# Patient Record
Sex: Female | Born: 1960 | Race: White | Hispanic: No | Smoking: Current every day smoker
Health system: Southern US, Community
[De-identification: ages and names within clinical notes are randomized; demographics above are authoritative.]

## PROBLEM LIST (undated history)

## (undated) HISTORY — PX: CHOLECYSTECTOMY: SHX55

## (undated) HISTORY — PX: ABDOMINAL HYSTERECTOMY: SHX81

---

## 2011-09-06 ENCOUNTER — Emergency Department (HOSPITAL_COMMUNITY): Payer: Self-pay

## 2011-09-06 ENCOUNTER — Other Ambulatory Visit: Payer: Self-pay

## 2011-09-06 ENCOUNTER — Emergency Department (HOSPITAL_COMMUNITY)
Admission: EM | Admit: 2011-09-06 | Discharge: 2011-09-06 | Disposition: A | Discharge status: Home / Self Care | Payer: No Typology Code available for payment source | Attending: Emergency Medicine | Admitting: Emergency Medicine

## 2011-09-06 DIAGNOSIS — M549 Dorsalgia, unspecified: Secondary | ICD-10-CM | POA: Insufficient documentation

## 2011-09-06 DIAGNOSIS — W010XXA Fall on same level from slipping, tripping and stumbling without subsequent striking against object, initial encounter: Secondary | ICD-10-CM | POA: Insufficient documentation

## 2011-09-06 DIAGNOSIS — W19XXXA Unspecified fall, initial encounter: Secondary | ICD-10-CM

## 2011-09-06 DIAGNOSIS — M25519 Pain in unspecified shoulder: Secondary | ICD-10-CM | POA: Insufficient documentation

## 2011-09-06 DIAGNOSIS — Y9229 Other specified public building as the place of occurrence of the external cause: Secondary | ICD-10-CM | POA: Insufficient documentation

## 2011-09-06 DIAGNOSIS — M542 Cervicalgia: Secondary | ICD-10-CM | POA: Insufficient documentation

## 2011-09-06 DIAGNOSIS — S161XXA Strain of muscle, fascia and tendon at neck level, initial encounter: Secondary | ICD-10-CM

## 2011-09-06 DIAGNOSIS — R51 Headache: Secondary | ICD-10-CM | POA: Insufficient documentation

## 2011-09-06 DIAGNOSIS — S139XXA Sprain of joints and ligaments of unspecified parts of neck, initial encounter: Secondary | ICD-10-CM | POA: Insufficient documentation

## 2011-09-06 DIAGNOSIS — S0990XA Unspecified injury of head, initial encounter: Secondary | ICD-10-CM | POA: Insufficient documentation

## 2011-09-06 LAB — POCT I-STAT, CHEM 8
BUN: 14 mg/dL (ref 6–23)
Creatinine, Ser: 1 mg/dL (ref 0.50–1.10)
Glucose, Bld: 127 mg/dL — ABNORMAL HIGH (ref 70–99)
Hemoglobin: 14.3 g/dL (ref 12.0–15.0)
Potassium: 4.1 mEq/L (ref 3.5–5.1)

## 2011-09-06 MED ORDER — IBUPROFEN 800 MG PO TABS: 800.0000 mg | ORAL_TABLET | Freq: Three times a day (TID) | ORAL | Status: AC

## 2011-09-06 MED ORDER — HYDROCODONE-ACETAMINOPHEN 5-325 MG PO TABS: 2.0000 | ORAL_TABLET | ORAL | Status: AC | PRN

## 2011-09-06 MED ORDER — HYDROCODONE-ACETAMINOPHEN 5-325 MG PO TABS
2.0000 | ORAL_TABLET | Freq: Once | ORAL | Status: AC
  Administered 2011-09-06: 2 via ORAL
  Filled 2011-09-06: qty 2

## 2011-09-06 NOTE — ED Notes (Signed)
Pt remains in xray.
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
## 2011-09-06 NOTE — ED Notes (Signed)
QMV:[id number]<NG> Expected date:09/06/11<BR> Expected time:12:26 PM<BR> Means of arrival:Ambulance<BR> Comments:<BR> EMS 40 GC - fall/head,neck and back pain

## 2011-09-06 NOTE — ED Provider Notes (Signed)
History     CSN: [id number] Arrival date & time: 09/06/2011  1:03 PM   First MD Initiated Contact with Patient 09/06/11 1307      Chief Complaint  Patient presents with  . Fall    (Consider location/radiation/quality/duration/timing/severity/associated sxs/prior treatment) HPI Comments: Patient presents by EMS after having a mechanical fall at [facility]. She states she was reaching into a bin to get a drink and fell on a wet spot on the floor. She hit the back of her head but did not lose consciousness. She denies any weakness, numbness, tingling. She did urinate on herself in the ambulance because she says she really had to use the bathroom and could not hold it. His any seizure activity, tongue biting, incontinence. Chest pain, abdominal pain, low back pain. She denies any dizziness, lightheadedness, visual change.  She is also complaining of some right shoulder and left knee pain from the fall.  The history is provided by the patient.    History reviewed. No pertinent past medical history.  History reviewed. No pertinent past surgical history.  History reviewed. No pertinent family history.  History  Substance Use Topics  . Smoking status: Not on file  . Smokeless tobacco: Not on file  . Alcohol Use:     OB History    Grav Para Term Preterm Abortions TAB SAB Ect Mult Living                  Review of Systems  Constitutional: Negative for fever, activity change and appetite change.  HENT: Positive for neck pain and neck stiffness. Negative for congestion and rhinorrhea.   Eyes: Negative for visual disturbance.  Respiratory: Negative for cough, choking and shortness of breath.   Cardiovascular: Negative for chest pain.  Gastrointestinal: Negative for nausea, vomiting and abdominal pain.  Genitourinary: Negative for dysuria and hematuria.  Musculoskeletal: Positive for back pain.  Skin: Negative for wound.  Neurological: Positive for headaches. Negative for dizziness  and weakness.  Hematological: Negative for adenopathy.    Allergies  Review of patient's allergies indicates no known allergies.  Home Medications   Current Outpatient Rx  Name Route Sig Dispense Refill  . ASPIRIN 325 MG PO TABS Oral Take 325 mg by mouth daily as needed. pain      . DEXTROMETHORPHAN-GUAIFENESIN 10-100 MG/5ML PO LIQD Oral Take 5 mLs by mouth every 4 (four) hours as needed. For cough and congestion     . HYDROCODONE-ACETAMINOPHEN 5-325 MG PO TABS Oral Take 2 tablets by mouth every 4 (four) hours as needed for pain. 10 tablet 0  . IBUPROFEN 800 MG PO TABS Oral Take 1 tablet (800 mg total) by mouth 3 (three) times daily. 21 tablet 0    BP 103/64  Pulse 62  Temp(Src) 98.4 F (36.9 C) (Oral)  Resp 20  SpO2 100%  Physical Exam  Constitutional: She is oriented to person, place, and time. She appears well-developed and well-nourished. No distress.  HENT:  Head: Normocephalic and atraumatic.  Right Ear: External ear normal.  Left Ear: External ear normal.  Mouth/Throat: Oropharynx is clear and moist. No oropharyngeal exudate.  Eyes: Conjunctivae and EOM are normal. Pupils are equal, round, and reactive to light.  Neck: Normal range of motion.       Diffuse tenderness in the midline and paraspinally without step-off or deformity  Cardiovascular: Normal rate, regular rhythm and normal heart sounds.   Pulmonary/Chest: Effort normal and breath sounds normal. No respiratory distress.  Abdominal: Soft. There  is no tenderness. There is no rebound and no guarding.  Musculoskeletal: Normal range of motion. She exhibits no edema and no tenderness.       No tenderness to T. or L-spine, no step-offs or deformity Full range of Motion left knee with extensor and flexor function intact.  No ligament laxity.  Neurological: She is alert and oriented to person, place, and time. No cranial nerve deficit.  Skin: Skin is warm.    ED Course  Procedures (including critical care  time)  Labs Reviewed  POCT I-STAT, CHEM 8 - Abnormal; Notable for the following:    Glucose, Bld 127 (*)    All other components within normal limits  I-STAT, CHEM 8   Dg Chest 2 View  09/06/2011  *RADIOLOGY REPORT*  Clinical Data: Fall.  Chest pain.  CHEST - 2 VIEW  Comparison:  None.  Findings:  The heart size and mediastinal contours are within normal limits.  Both lungs are clear.  No evidence of pneumothorax or pleural effusion.  The visualized skeletal structures are unremarkable.  IMPRESSION: No active cardiopulmonary disease.  Original Report Authenticated By: Danae Orleans, M.D.   Dg Shoulder Right  09/06/2011  *RADIOLOGY REPORT*  Clinical Data: Fall.  Shoulder injury and pain.  RIGHT SHOULDER - 2+ VIEW  Comparison: None.  Findings: No evidence of acute fracture or dislocation.  Soft tissue calcification is seen near the distal rotator cuff tendon insertion along the lateral aspect of the humeral head, suspicious for calcific tendonitis.  IMPRESSION:  1.  No acute findings. 2.  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  Original Report Authenticated By: Danae Orleans, M.D.   Ct Head Wo Contrast  09/06/2011  *RADIOLOGY REPORT*  Clinical Data: The patient fell and hit back of the head today. Headache and neck pain.  CT HEAD WITHOUT CONTRAST  Technique:  Contiguous axial images were obtained from the base of the skull through the vertex without contrast.  Comparison: None.  Findings: There are no midline shifts or mass lesions.  There is no CT scan evidence for hemorrhage or acute infarction.  The ventricles are normal in size and contour.  Bone windows settings demonstrate no fractures or sinus air fluid levels. There is a 1 cm in size low density sub dermal scalp lesion within the right occipital region.  Most likely this represents a small incidental sebaceous cyst.  IMPRESSION: Probable small occipital sebaceous cyst.  No acute intracranial findings.  Original  Report Authenticated By: Rolla Plate, M.D.   Ct Cervical Spine Wo Contrast  09/06/2011  *RADIOLOGY REPORT*  Clinical Data: Fall.  Neck pain.  CT CERVICAL SPINE WITHOUT CONTRAST  Technique:  Multidetector CT imaging of the cervical spine was performed. Multiplanar CT image reconstructions were also generated.  Comparison: None.  Findings: Degenerative space narrowing present C5-6 level.  There is annular calcification associated with the disc posteriorly at this level.  There are no fractures or subluxations.  The C1-2 articulation has a normal appearance.  The prevertebral soft tissues have a normal appearance.  IMPRESSION: Degenerative disc space narrowing noted at the C5-6 level.  No acute findings.  Original Report Authenticated By: Rolla Plate, M.D.   Dg Knee Complete 4 Views Left  09/06/2011  *RADIOLOGY REPORT*  Clinical Data: Generalized left knee pain.  LEFT KNEE - COMPLETE 4+ VIEW  Comparison: None.  Findings: Moderately advanced osteoarthritis with primarily medial joint space loss and both medial and lateral proliferative changes. There are some  mild proliferative changes involving the patella. No knee joint effusion.  No acute fracture or dislocation.  IMPRESSION: Osteoarthritis primarily involving the medial joint space.  No evidence of acute fracture.  Original Report Authenticated By: Reola Calkins, M.D.     1. Fall   2. Cervical strain   3. Head injury       MDM  Mechanical fall after slipping on a wet floor. No loss of consciousness, normal neurological exam. She urinated on herself voluntarily. No reported seizure activity, tongue biting or incontinence.    Date: 09/06/2011  Rate: 68  Rhythm: normal sinus rhythm  QRS Axis: normal  Intervals: normal  ST/T Wave abnormalities: normal  Conduction Disutrbances:none  Narrative Interpretation:   Old EKG Reviewed: none available  Patient's imaging is reviewed and she has no acute injuries.  She is alert and  oriented, has a normal neuro exam.  She is ambulatory in the department.   Glynn Octave, MD 09/06/11 [phone]

## 2011-09-06 NOTE — ED Notes (Signed)
Pt was at [facility] and states that she fell on a wet spot on the floor.  She thinks that she hit the back of her head.  She is also c/o neck and upper back pain.

## 2011-09-06 NOTE — ED Notes (Signed)
Returned from xray

## 2012-08-06 ENCOUNTER — Emergency Department (HOSPITAL_COMMUNITY)
Admission: EM | Admit: 2012-08-06 | Discharge: 2012-08-06 | Disposition: A | Discharge status: Home / Self Care | Payer: No Typology Code available for payment source | Attending: Emergency Medicine | Admitting: Emergency Medicine

## 2012-08-06 ENCOUNTER — Emergency Department (HOSPITAL_COMMUNITY): Payer: No Typology Code available for payment source

## 2012-08-06 ENCOUNTER — Encounter (HOSPITAL_COMMUNITY): Payer: Self-pay | Admitting: *Deleted

## 2012-08-06 DIAGNOSIS — E119 Type 2 diabetes mellitus without complications: Secondary | ICD-10-CM | POA: Insufficient documentation

## 2012-08-06 DIAGNOSIS — R109 Unspecified abdominal pain: Secondary | ICD-10-CM | POA: Insufficient documentation

## 2012-08-06 DIAGNOSIS — Y9389 Activity, other specified: Secondary | ICD-10-CM | POA: Insufficient documentation

## 2012-08-06 DIAGNOSIS — S2249XA Multiple fractures of ribs, unspecified side, initial encounter for closed fracture: Secondary | ICD-10-CM | POA: Insufficient documentation

## 2012-08-06 DIAGNOSIS — R569 Unspecified convulsions: Secondary | ICD-10-CM | POA: Insufficient documentation

## 2012-08-06 DIAGNOSIS — Z9889 Other specified postprocedural states: Secondary | ICD-10-CM | POA: Insufficient documentation

## 2012-08-06 DIAGNOSIS — J4489 Other specified chronic obstructive pulmonary disease: Secondary | ICD-10-CM | POA: Insufficient documentation

## 2012-08-06 DIAGNOSIS — S199XXA Unspecified injury of neck, initial encounter: Secondary | ICD-10-CM | POA: Insufficient documentation

## 2012-08-06 DIAGNOSIS — S2241XA Multiple fractures of ribs, right side, initial encounter for closed fracture: Secondary | ICD-10-CM

## 2012-08-06 DIAGNOSIS — Z90710 Acquired absence of both cervix and uterus: Secondary | ICD-10-CM | POA: Insufficient documentation

## 2012-08-06 DIAGNOSIS — S0993XA Unspecified injury of face, initial encounter: Secondary | ICD-10-CM | POA: Insufficient documentation

## 2012-08-06 DIAGNOSIS — F17200 Nicotine dependence, unspecified, uncomplicated: Secondary | ICD-10-CM | POA: Insufficient documentation

## 2012-08-06 DIAGNOSIS — J449 Chronic obstructive pulmonary disease, unspecified: Secondary | ICD-10-CM | POA: Insufficient documentation

## 2012-08-06 MED ORDER — HYDROMORPHONE HCL PF 1 MG/ML IJ SOLN
1.0000 mg | Freq: Once | INTRAMUSCULAR | Status: AC
  Administered 2012-08-06: 1 mg via INTRAVENOUS
  Filled 2012-08-06: qty 1

## 2012-08-06 MED ORDER — KETOROLAC TROMETHAMINE 30 MG/ML IJ SOLN
30.0000 mg | Freq: Once | INTRAMUSCULAR | Status: AC
  Administered 2012-08-06: 30 mg via INTRAVENOUS
  Filled 2012-08-06: qty 1

## 2012-08-06 MED ORDER — NAPROXEN 500 MG PO TABS: 500.0000 mg | ORAL_TABLET | Freq: Two times a day (BID) | ORAL | Status: DC

## 2012-08-06 MED ORDER — OXYCODONE-ACETAMINOPHEN 5-325 MG PO TABS: 1.0000 | ORAL_TABLET | ORAL | Status: DC | PRN

## 2012-08-06 MED ORDER — IOHEXOL 300 MG/ML  SOLN
100.0000 mL | Freq: Once | INTRAMUSCULAR | Status: AC | PRN
  Administered 2012-08-06: 100 mL via INTRAVENOUS

## 2012-08-06 NOTE — ED Notes (Signed)
I placed a medium c-collar on the patient. 

## 2012-08-06 NOTE — ED Notes (Signed)
IS taught demonstrated modeled. Signature pad signed.

## 2012-08-06 NOTE — ED Notes (Signed)
Ambulated into h/w, steady gait, with stand by assist, (denies: sob, nausea or dizziness), "just severe pain with movement, deep inspiration and cough". VSS, improved after IVF & ambulation. Pt called for ride home, "sister on her way".

## 2012-08-06 NOTE — ED Notes (Signed)
EDP into room 

## 2012-08-06 NOTE — ED Notes (Signed)
Coached to frequently/ routinely cough and take deep breaths. Pt demonstrates well.
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
## 2012-08-06 NOTE — ED Notes (Signed)
BP & HR low, EDP made aware, order received to ambulate and re-check.

## 2012-08-06 NOTE — ED Notes (Signed)
Out to d/c desk in w/c, out with RN to w/r, sitting in w/c in w/r, waiting for sister to arrive, nurse & tech first updated. Alert, NAD, calm, interactive.

## 2012-08-06 NOTE — ED Notes (Signed)
Family into room. 
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
## 2012-08-06 NOTE — ED Provider Notes (Signed)
Prior to being discharged, the patient had a borderline blood pressure, she ambulated with minimal assistance only secondary to her rib pain, oxygenation remained normal, no tachycardia, no hypotension with a blood pressure of 105 systolic. The patient is stable for discharge and the CT confirms no mediastinal cardiac or pulmonary injuries. There is also no signs of intraoral abdominal injuries or bleeding.  Vida Roller, MD 08/06/12 [phone]
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
## 2016-04-03 ENCOUNTER — Emergency Department (HOSPITAL_COMMUNITY)
Admission: EM | Admit: 2016-04-03 | Discharge: 2016-04-03 | Disposition: A | Discharge status: Home / Self Care | Payer: No Typology Code available for payment source | Attending: Emergency Medicine | Admitting: Emergency Medicine

## 2016-04-03 ENCOUNTER — Encounter (HOSPITAL_COMMUNITY): Payer: Self-pay | Admitting: Emergency Medicine

## 2016-04-03 DIAGNOSIS — R159 Full incontinence of feces: Secondary | ICD-10-CM | POA: Insufficient documentation

## 2016-04-03 DIAGNOSIS — IMO0002 Reserved for concepts with insufficient information to code with codable children: Secondary | ICD-10-CM

## 2016-04-03 DIAGNOSIS — N8110 Cystocele, unspecified: Secondary | ICD-10-CM | POA: Insufficient documentation

## 2016-04-03 DIAGNOSIS — E119 Type 2 diabetes mellitus without complications: Secondary | ICD-10-CM | POA: Insufficient documentation

## 2016-04-03 DIAGNOSIS — Z79899 Other long term (current) drug therapy: Secondary | ICD-10-CM | POA: Insufficient documentation

## 2016-04-03 DIAGNOSIS — Z7984 Long term (current) use of oral hypoglycemic drugs: Secondary | ICD-10-CM | POA: Insufficient documentation

## 2016-04-03 DIAGNOSIS — F17200 Nicotine dependence, unspecified, uncomplicated: Secondary | ICD-10-CM | POA: Insufficient documentation

## 2016-04-03 LAB — URINE MICROSCOPIC-ADD ON: RBC / HPF: NONE SEEN RBC/hpf (ref 0–5)

## 2016-04-03 LAB — URINALYSIS, ROUTINE W REFLEX MICROSCOPIC
BILIRUBIN URINE: NEGATIVE
Glucose, UA: >1000 mg/dL — AB
Hgb urine dipstick: NEGATIVE
KETONES UR: NEGATIVE mg/dL
LEUKOCYTES UA: NEGATIVE
NITRITE: NEGATIVE
PROTEIN: NEGATIVE mg/dL
Specific Gravity, Urine: 1.035 — ABNORMAL HIGH (ref 1.005–1.030)
pH: 6 (ref 5.0–8.0)

## 2016-04-03 LAB — BASIC METABOLIC PANEL
ANION GAP: 10 (ref 5–15)
BUN: 19 mg/dL (ref 6–20)
CALCIUM: 9.9 mg/dL (ref 8.9–10.3)
CO2: 26 mmol/L (ref 22–32)
CREATININE: 1.06 mg/dL — AB (ref 0.44–1.00)
Chloride: 98 mmol/L — ABNORMAL LOW (ref 101–111)
GFR, EST NON AFRICAN AMERICAN: 58 mL/min — AB (ref >60)
Glucose, Bld: 383 mg/dL — ABNORMAL HIGH (ref 65–99)
Potassium: 4.7 mmol/L (ref 3.5–5.1)
Sodium: 134 mmol/L — ABNORMAL LOW (ref 135–145)

## 2016-04-03 LAB — CBC
HCT: 42.6 % (ref 36.0–46.0)
HEMOGLOBIN: 14.1 g/dL (ref 12.0–15.0)
MCH: 28.7 pg (ref 26.0–34.0)
MCHC: 33.1 g/dL (ref 30.0–36.0)
MCV: 86.8 fL (ref 78.0–100.0)
PLATELETS: 255 10*3/uL (ref 150–400)
RBC: 4.91 MIL/uL (ref 3.87–5.11)
RDW: 12.4 % (ref 11.5–15.5)
WBC: 10.9 10*3/uL — ABNORMAL HIGH (ref 4.0–10.5)

## 2016-04-03 MED ORDER — METFORMIN HCL 500 MG PO TABS: 500.0000 mg | ORAL_TABLET | Freq: Two times a day (BID) | ORAL | Status: AC

## 2016-04-03 MED ORDER — METFORMIN HCL 500 MG PO TABS
500.0000 mg | ORAL_TABLET | Freq: Once | ORAL | Status: AC
  Administered 2016-04-03: 500 mg via ORAL
  Filled 2016-04-03: qty 1

## 2016-04-03 NOTE — Care Management Note (Signed)
Case Management Note  Patient Details  Name: Skip Mayerondra XXXCovington MRN: [id number] Date of Birth: 31-Mar-1961  Subjective/Objective: 55 y.o. F seen in the ED 04/03/2016. Patient had sought treatment in the ED for prolapsed bladder? and found to have Elevated Blood glucose. CM consulted by MD for new PCP and medication needs.  Pt tearful and crying uncontrollably throughout our discussion of need for PCP and other issues involving her new Diabetes diagnosis. Tommas OlpFiance, Byron at the bedside extremely helpful and willing to assist this pt. Janese Banksondra can be reached @ [phone] and Ortencia KickByron can be reached at [phone].   [facility] as they have Open clinic hours each week. Encouraged her to arrive early and be patient. Provided the couple with bus passes for transportation to the clinic for this week. They understand the importance of obtaining a PCP, Following up on this hospital visit and obtaining assistance with Insurance. All of this is provided at the clinic.   CM reviewed EPIC notes and chart review information CM [facility] ($[facility], does not include refills, 7 day expiration of MATCH letter and choice of pharmacies) Pt agreed to receive assistance from program   [facility] (unable to find pt listed in PDMI per cardholder name inquiry) PDMI information entered. MATCH letter completed and provided to pt.  CM updated EDP and ED RN              Action/Plan:CM will sign off for now but will be available should additional discharge needs arise or disposition change.    Expected Discharge Date:                  Expected Discharge Plan:     In-House Referral:  PCP / [facility], Clinical Social Work  Discharge planning Services  CM Consult, Medication Assistance  Post Acute Care Choice:    Choice offered to:  Patient  DME Arranged:    DME Agency:     HH  Arranged:    HH Agency:     Status of Service:  Completed, signed off  Medicare Important Message Given:    Date Medicare IM Given:    Medicare IM give by:    Date Additional Medicare IM Given:    Additional Medicare Important Message give by:     If discussed at Long Length of Stay Meetings, dates discussed:    Additional Comments:  Yvone NeuCrutchfield, Sue Mcalexander M, RN 04/03/2016, 11:14 AM

## 2016-04-03 NOTE — ED Provider Notes (Signed)
CSN: [id number]     Arrival date & time 04/03/16  0701 History   First MD Initiated Contact with Patient 04/03/16 [phone]     Chief Complaint  Patient presents with  . Urinary Incontinence  . Encopresis     (Consider location/radiation/quality/duration/timing/severity/associated sxs/prior Treatment) HPI   55yo female presents with progressively worsening urinary incontinence, 2 episodes of encoparesis which started this AM.  Urinary incontinence has been progressively worsening for months. Reports urinary incontinence with cough, laugh, sneeze, but also leaking when she stands up.  Feels like something is protruding Tried bladder pill for UTI over the counter, has not seen physician.  Encopresis started today, no other n/v/abd pain/fever.    History reviewed. No pertinent past medical history. Past Surgical History  Procedure Laterality Date  . Cholecystectomy    . Abdominal hysterectomy     No family history on file. Social History  Substance Use Topics  . Smoking status: Current Every Day Smoker  . Smokeless tobacco: None  . Alcohol Use: Yes   OB History    No data available     Review of Systems  Constitutional: Negative for fever.  HENT: Negative for sore throat.   Eyes: Negative for visual disturbance.  Respiratory: Negative for cough and shortness of breath.   Cardiovascular: Negative for chest pain.  Gastrointestinal: Negative for nausea, vomiting, abdominal pain and constipation. Diarrhea: encopresis x2.  Genitourinary: Positive for difficulty urinating (incontinence).  Musculoskeletal: Negative for back pain and neck pain.  Skin: Negative for rash.  Neurological: Negative for syncope and headaches.      Allergies  Penicillins  Home Medications   Prior to Admission medications   Medication Sig Start Date End Date Taking? Authorizing Provider  metFORMIN (GLUCOPHAGE) 500 MG tablet Take 1 tablet (500 mg total) by mouth 2 (two) times daily with a meal. 04/03/16  05/03/16  Alvira MondayErin Makinzee Durley, MD  naproxen (NAPROSYN) 500 MG tablet Take 1 tablet (500 mg total) by mouth 2 (two) times daily with a meal. Patient not taking: Reported on 04/03/2016 08/06/12   Eber HongBrian Miller, MD  oxyCODONE-acetaminophen (PERCOCET) 5-325 MG per tablet Take 1 tablet by mouth every 4 (four) hours as needed for pain. Patient not taking: Reported on 04/03/2016 08/06/12   Eber HongBrian Miller, MD   BP 108/69 mmHg  Pulse 87  Temp(Src) 98.4 F (36.9 C) (Oral)  Resp 18  SpO2 100% Physical Exam  Constitutional: She is oriented to person, place, and time. She appears well-developed and well-nourished. No distress.  HENT:  Head: Normocephalic and atraumatic.  Eyes: Conjunctivae and EOM are normal.  Neck: Normal range of motion.  Cardiovascular: Normal rate, regular rhythm and intact distal pulses.   Pulmonary/Chest: Effort normal. No respiratory distress.  Abdominal: Soft. She exhibits no distension. There is tenderness (diffuse mild). There is no guarding.  Genitourinary: There is no rash or tenderness on the right labia. There is no rash or tenderness on the left labia.  Cystocele visualized in lower portion of vagina  Musculoskeletal: She exhibits no edema or tenderness.  Neurological: She is alert and oriented to person, place, and time.  Skin: Skin is warm and dry. No rash noted. She is not diaphoretic. No erythema.  Nursing note and vitals reviewed.   ED Course  Procedures (including critical care time) Labs Review Labs Reviewed  URINALYSIS, ROUTINE W REFLEX MICROSCOPIC (NOT AT [facility]) - Abnormal; Notable for the following:    APPearance CLOUDY (*)    Specific Gravity, Urine 1.035 (*)  Glucose, UA >1000 (*)    All other components within normal limits  BASIC METABOLIC PANEL - Abnormal; Notable for the following:    Sodium 134 (*)    Chloride 98 (*)    Glucose, Bld 383 (*)    Creatinine, Ser 1.06 (*)    GFR calc non Af Amer 58 (*)    All other components within normal limits   CBC - Abnormal; Notable for the following:    WBC 10.9 (*)    All other components within normal limits  URINE MICROSCOPIC-ADD ON - Abnormal; Notable for the following:    Squamous Epithelial / LPF 0-5 (*)    Bacteria, UA MANY (*)    All other components within normal limits  URINE CULTURE    Imaging Review No results found. I have personally reviewed and evaluated these images and lab results as part of my medical decision-making.   EKG Interpretation None      MDM   Final diagnoses:  Cystocele  Type 2 diabetes mellitus without complication, without long-term current use of insulin (HCC)   55 year old female with history of hysterectomy presents with concern for months of progressive urinary incontinent continence, feeling a protrusion from vagina, with encopresis beginning last night.  Patient denies any back pain today, and given duration of symptoms, no leg weakness, presence of cystocele on exam, doubt cauda equina or spinal etiology of symptoms.  She does report right leg numbness which is chronic, which goes from back to bottom of toes,  However this is also chronic, more consistent with sciatica, no weakness.    Labs were obtained through triage protocol, and showed hyperglycemia with a glucose of 380.  There are no signs of DKA, no anion gap, normal bicarbonate. Patient was given a dose of metformin in the emergency department, is all prescription for the same to take 500 mg twice a day. Discussed likely diabetes diagnosis with patient in detail and alterations in diet and exercise. Discussed with case management, who came to bedside and saw patient, provided resources including resources for follow-up at [facility] for help with medications, insurance an appointment, as well as provided with medication assistance information.   For urinary symptoms, Patient's history and physical exam is most consistent with cystocele. Discussed pelvic floor  exercises, need for GYN follow up. Patient discharged in stable condition with understanding of reasons to return.   Alvira Monday, MD 04/03/16 1143

## 2016-04-03 NOTE — ED Notes (Signed)
Case management at bedside.

## 2016-04-03 NOTE — ED Notes (Signed)
Pt reports intermittent urinary incontinence for past month, recent bowel incontinence.  Pt states, "I know my bladder is trying to fall out."   Pt reports sensation of "something pressing between my legs."

## 2016-04-03 NOTE — ED Notes (Signed)
MD at bedside. 

## 2016-04-03 NOTE — Discharge Instructions (Signed)

## 2016-04-05 LAB — URINE CULTURE

## 2016-04-07 MED FILL — metFORMIN HCL 500 MG TABS: 500 | 30 days supply | Qty: 60 | Fill #0

## 2022-03-08 ENCOUNTER — Emergency Department (HOSPITAL_COMMUNITY): Payer: Medicaid Other

## 2022-03-08 ENCOUNTER — Encounter (HOSPITAL_COMMUNITY): Payer: Self-pay | Admitting: Student

## 2022-03-08 ENCOUNTER — Emergency Department (HOSPITAL_COMMUNITY)
Admission: EM | Admit: 2022-03-08 | Discharge: 2022-03-09 | Disposition: A | Discharge status: Home / Self Care | Payer: Medicaid Other | Attending: Emergency Medicine | Admitting: Emergency Medicine

## 2022-03-08 ENCOUNTER — Other Ambulatory Visit: Payer: Self-pay

## 2022-03-08 DIAGNOSIS — Z7984 Long term (current) use of oral hypoglycemic drugs: Secondary | ICD-10-CM | POA: Insufficient documentation

## 2022-03-08 DIAGNOSIS — F17200 Nicotine dependence, unspecified, uncomplicated: Secondary | ICD-10-CM | POA: Insufficient documentation

## 2022-03-08 DIAGNOSIS — R0602 Shortness of breath: Secondary | ICD-10-CM | POA: Diagnosis not present

## 2022-03-08 DIAGNOSIS — R0789 Other chest pain: Secondary | ICD-10-CM | POA: Diagnosis not present

## 2022-03-08 DIAGNOSIS — E1165 Type 2 diabetes mellitus with hyperglycemia: Secondary | ICD-10-CM | POA: Diagnosis not present

## 2022-03-08 DIAGNOSIS — R739 Hyperglycemia, unspecified: Secondary | ICD-10-CM

## 2022-03-08 DIAGNOSIS — Z720 Tobacco use: Secondary | ICD-10-CM

## 2022-03-08 LAB — COMPREHENSIVE METABOLIC PANEL
ALT: 33 U/L (ref 0–44)
AST: 23 U/L (ref 15–41)
Albumin: 4 g/dL (ref 3.5–5.0)
Alkaline Phosphatase: 91 U/L (ref 38–126)
Anion gap: 7 (ref 5–15)
BUN: 26 mg/dL — ABNORMAL HIGH (ref 8–23)
CO2: 27 mmol/L (ref 22–32)
Calcium: 9.4 mg/dL (ref 8.9–10.3)
Chloride: 104 mmol/L (ref 98–111)
Creatinine, Ser: 0.79 mg/dL (ref 0.44–1.00)
GFR, Estimated: >60 mL/min (ref >60)
Glucose, Bld: 222 mg/dL — ABNORMAL HIGH (ref 70–99)
Potassium: 3.8 mmol/L (ref 3.5–5.1)
Sodium: 138 mmol/L (ref 135–145)
Total Bilirubin: 0.8 mg/dL (ref 0.3–1.2)
Total Protein: 8 g/dL (ref 6.5–8.1)

## 2022-03-08 LAB — CBC WITH DIFFERENTIAL/PLATELET
Abs Immature Granulocytes: 0.02 10*3/uL (ref 0.00–0.07)
Basophils Absolute: 0 10*3/uL (ref 0.0–0.1)
Basophils Relative: 0 %
Eosinophils Absolute: 0.1 10*3/uL (ref 0.0–0.5)
Eosinophils Relative: 2 %
HCT: 44.3 % (ref 36.0–46.0)
Hemoglobin: 14.6 g/dL (ref 12.0–15.0)
Immature Granulocytes: 0 %
Lymphocytes Relative: 31 %
Lymphs Abs: 2 10*3/uL (ref 0.7–4.0)
MCH: 29.7 pg (ref 26.0–34.0)
MCHC: 33 g/dL (ref 30.0–36.0)
MCV: 90.2 fL (ref 80.0–100.0)
Monocytes Absolute: 0.5 10*3/uL (ref 0.1–1.0)
Monocytes Relative: 8 %
Neutro Abs: 3.8 10*3/uL (ref 1.7–7.7)
Neutrophils Relative %: 59 %
Platelets: 229 10*3/uL (ref 150–400)
RBC: 4.91 MIL/uL (ref 3.87–5.11)
RDW: 12.5 % (ref 11.5–15.5)
WBC: 6.5 10*3/uL (ref 4.0–10.5)
nRBC: 0 % (ref 0.0–0.2)

## 2022-03-08 LAB — TROPONIN I (HIGH SENSITIVITY): Troponin I (High Sensitivity): 4 ng/L (ref <18)

## 2022-03-08 LAB — D-DIMER, QUANTITATIVE: D-Dimer, Quant: 0.48 ug/mL-FEU (ref 0.00–0.50)

## 2022-03-08 MED ORDER — IPRATROPIUM-ALBUTEROL 0.5-2.5 (3) MG/3ML IN SOLN
3.0000 mL | Freq: Once | RESPIRATORY_TRACT | Status: AC
  Administered 2022-03-08: 3 mL via RESPIRATORY_TRACT
  Filled 2022-03-08: qty 3

## 2022-03-08 NOTE — ED Triage Notes (Addendum)
Patient c/o SOB and weakness x 2-3 months. Per report dyspnea getting worse for the past weeks. Patient report molds is growing on her apartment that might cause her SOB. Pt denies N/V. Pt a/xo4.

## 2022-03-08 NOTE — ED Provider Notes (Signed)
[facility]-EMERGENCY DEPT Provider Note   CSN: [id number] Arrival date & time: 03/08/22  2110     History {Add pertinent medical, surgical, social history, OB history to HPI:1} Chief Complaint  Patient presents with   Shortness of Breath    Victoria Martinez is a 61 y.o. female with a hx of tobacco use who presents to the ED with complaints of *** HPI     Home Medications Prior to Admission medications   Medication Sig Start Date End Date Taking? Authorizing Provider  metFORMIN (GLUCOPHAGE) 500 MG tablet Take 1 tablet (500 mg total) by mouth 2 (two) times daily with a meal. 04/03/16 03/08/22 Yes Schlossman, Denny Peon, MD  naproxen (NAPROSYN) 500 MG tablet Take 1 tablet (500 mg total) by mouth 2 (two) times daily with a meal. Patient not taking: Reported on 03/08/2022 08/06/12   Eber Hong, MD  oxyCODONE-acetaminophen (PERCOCET) 5-325 MG per tablet Take 1 tablet by mouth every 4 (four) hours as needed for pain. Patient not taking: Reported on 04/03/2016 08/06/12   Eber Hong, MD      Allergies    Penicillins    Review of Systems   Review of Systems  Physical Exam Updated Vital Signs BP 100/72 (BP Location: Left Arm)   Pulse 82   Temp 98.1 F (36.7 C) (Oral)   Resp 18   Ht 5\' 3"  (1.6 m)   Wt 70.3 kg   SpO2 97%   BMI 27.46 kg/m  Physical Exam  ED Results / Procedures / Treatments   Labs (all labs ordered are listed, but only abnormal results are displayed) Labs Reviewed  COMPREHENSIVE METABOLIC PANEL - Abnormal; Notable for the following components:      Result Value   Glucose, Bld 222 (*)    BUN 26 (*)    All other components within normal limits  CBC WITH DIFFERENTIAL/PLATELET    EKG None  Radiology DG Chest 2 View  Result Date: 03/08/2022 CLINICAL DATA:  Shortness of breath EXAM: CHEST - 2 VIEW COMPARISON:  09/06/2011 FINDINGS: The heart size and mediastinal contours are within normal limits. Aortic atherosclerosis. Both lungs are  clear. The visualized skeletal structures are unremarkable. IMPRESSION: No active cardiopulmonary disease. Electronically Signed   By: 09/08/2011 M.D.   On: 03/08/2022 22:05    Procedures Procedures  {Document cardiac monitor, telemetry assessment procedure when appropriate:1}  Medications Ordered in ED Medications - No data to display  ED Course/ Medical Decision Making/ A&P                           Medical Decision Making Amount and/or Complexity of Data Reviewed Labs: ordered.   Patient presents to the ED with complaints of ***, this involves an extensive number of treatment options, and is a complaint that carries with it a high risk of complications and morbidity. Nontoxic, vitals ***.   Ddx including but not limited to: ***  Additional history obtained:  Chart & nursing note reviewed.  ***  EKG: No STEMI, ***  Lab Tests:  I viewed & interpreted labs including:  CBC: unremarkable, no critical anemia.  CMP: mild hyperglycemia *** no acidosis or anion gap elevation, mild elevation in BUN, no critical electrolyte derangement.   Imaging Studies:  I viewed the following imaging, agree with radiologist impression:  No active cardiopulmonary disease.   ED Course:  I ordered medications including *** for ***  ***: RE-EVAL: ***   Based on  patient's chief complaint, I considered admission might be necessary, however after reassuring ED workup feel patient is reasonable for discharge.   Critical Interventions: ***  Amount and/or Complexity of Data Reviewed Independent Historian: *** External Data Reviewed: *** Labs: *** Ordered, viewed, and interpreted- Decision-making details documented in ED Course. Radiology: ***Ordered, viewed, and interpretation performed. Decision-making details documented in ED Course. ECG/medicine tests: ***ordered and independent interpretation performed. Discussion of management or test interpretation with external provider(s): *** Social  determinants: ***   Portions of this note were generated with Scientist, clinical (histocompatibility and immunogenetics). Dictation errors may occur despite best attempts at proofreading.   {Document critical care time when appropriate:1} {Document review of labs and clinical decision tools ie heart score, Chads2Vasc2 etc:1}  {Document your independent review of radiology images, and any outside records:1} {Document your discussion with family members, caretakers, and with consultants:1} {Document social determinants of health affecting pt's care:1} {Document your decision making why or why not admission, treatments were needed:1} Final Clinical Impression(s) / ED Diagnoses Final diagnoses:  None    Rx / DC Orders ED Discharge Orders     None

## 2022-03-08 NOTE — ED Notes (Signed)
Blue tube sent to lab 

## 2022-03-09 LAB — TROPONIN I (HIGH SENSITIVITY)
Troponin I (High Sensitivity): 45 ng/L — ABNORMAL HIGH (ref <18)
Troponin I (High Sensitivity): 5 ng/L (ref <18)

## 2022-03-09 MED ORDER — ALBUTEROL SULFATE HFA 108 (90 BASE) MCG/ACT IN AERS
2.0000 | INHALATION_SPRAY | Freq: Once | RESPIRATORY_TRACT | Status: AC
  Administered 2022-03-09: 2 via RESPIRATORY_TRACT
  Filled 2022-03-09: qty 6.7

## 2022-03-09 MED ORDER — FLUTICASONE PROPIONATE 50 MCG/ACT NA SUSP: 1.0000 | Freq: Every day | NASAL | 0 refills | Status: AC

## 2022-03-09 MED ORDER — CETIRIZINE HCL 10 MG PO TABS: 10.0000 mg | ORAL_TABLET | Freq: Every day | ORAL | 0 refills | Status: AC

## 2022-03-09 NOTE — ED Notes (Signed)
Family still willing to wait for the specific NT to stick the patient. Will let the NT know.

## 2022-03-09 NOTE — ED Notes (Signed)
I provided reinforced discharge education based off of discharge instructions. Pt acknowledged and understood my education. Pt had no further questions/concerns for provider/myself.  °

## 2022-03-09 NOTE — Discharge Instructions (Addendum)
You were seen in the ER today for trouble breathing.  Your labs showed elevated blood sugar and BUN (kidney function test) please take your metformin as prescribed and have these rechecked by your primary care provider within 1 week. If you do not have a primary care provider see circled phone number for assistance or see our community clinic.   Please try to decrease your tobacco use as this can contribute to trouble breathing.    Please take Zyrtec daily and use Flonase 1 spray per nostril daily to help with possible allergic process. You may be allergic to the mold in your home which could be contributing to your shortness of breath.   Please use albuterol inhaler 1 to 2 puffs every 4-6 hours as needed for trouble breathing.  We have prescribed you new medication(s) today. Discuss the medications prescribed today with your pharmacist as they can have adverse effects and interactions with your other medicines including over the counter and prescribed medications. Seek medical evaluation if you start to experience new or abnormal symptoms after taking one of these medicines, seek care immediately if you start to experience difficulty breathing, feeling of your throat closing, facial swelling, or rash as these could be indications of a more serious allergic reaction  Please follow up with pulmonology, allergy, and primary care.   Return to the emergency department for new or worsening symptoms including but not limited to increased work of breathing, new or worsening pain, chest pain, passing out, coughing up blood, fever, or any other concerns.

## 2022-03-09 NOTE — ED Notes (Signed)
Pt insisted she needed to use the restroom before I start IV.

## 2022-03-21 DIAGNOSIS — F321 Major depressive disorder, single episode, moderate: Secondary | ICD-10-CM | POA: Diagnosis not present

## 2022-03-22 DIAGNOSIS — F321 Major depressive disorder, single episode, moderate: Secondary | ICD-10-CM | POA: Diagnosis not present

## 2022-03-23 DIAGNOSIS — F321 Major depressive disorder, single episode, moderate: Secondary | ICD-10-CM | POA: Diagnosis not present

## 2022-03-24 DIAGNOSIS — F321 Major depressive disorder, single episode, moderate: Secondary | ICD-10-CM | POA: Diagnosis not present

## 2022-03-25 DIAGNOSIS — F321 Major depressive disorder, single episode, moderate: Secondary | ICD-10-CM | POA: Diagnosis not present

## 2022-03-26 DIAGNOSIS — F321 Major depressive disorder, single episode, moderate: Secondary | ICD-10-CM | POA: Diagnosis not present

## 2022-03-28 DIAGNOSIS — F321 Major depressive disorder, single episode, moderate: Secondary | ICD-10-CM | POA: Diagnosis not present

## 2022-03-29 DIAGNOSIS — F321 Major depressive disorder, single episode, moderate: Secondary | ICD-10-CM | POA: Diagnosis not present

## 2022-03-30 DIAGNOSIS — F321 Major depressive disorder, single episode, moderate: Secondary | ICD-10-CM | POA: Diagnosis not present

## 2022-03-31 DIAGNOSIS — F321 Major depressive disorder, single episode, moderate: Secondary | ICD-10-CM | POA: Diagnosis not present

## 2022-04-01 DIAGNOSIS — F321 Major depressive disorder, single episode, moderate: Secondary | ICD-10-CM | POA: Diagnosis not present

## 2022-04-02 DIAGNOSIS — F321 Major depressive disorder, single episode, moderate: Secondary | ICD-10-CM | POA: Diagnosis not present

## 2022-04-04 DIAGNOSIS — F321 Major depressive disorder, single episode, moderate: Secondary | ICD-10-CM | POA: Diagnosis not present

## 2022-04-05 DIAGNOSIS — F321 Major depressive disorder, single episode, moderate: Secondary | ICD-10-CM | POA: Diagnosis not present

## 2022-04-06 DIAGNOSIS — F321 Major depressive disorder, single episode, moderate: Secondary | ICD-10-CM | POA: Diagnosis not present

## 2022-04-07 DIAGNOSIS — F321 Major depressive disorder, single episode, moderate: Secondary | ICD-10-CM | POA: Diagnosis not present

## 2022-04-08 DIAGNOSIS — F321 Major depressive disorder, single episode, moderate: Secondary | ICD-10-CM | POA: Diagnosis not present

## 2022-04-09 DIAGNOSIS — F321 Major depressive disorder, single episode, moderate: Secondary | ICD-10-CM | POA: Diagnosis not present

## 2022-04-11 DIAGNOSIS — F321 Major depressive disorder, single episode, moderate: Secondary | ICD-10-CM | POA: Diagnosis not present

## 2022-04-12 DIAGNOSIS — F321 Major depressive disorder, single episode, moderate: Secondary | ICD-10-CM | POA: Diagnosis not present

## 2022-04-13 DIAGNOSIS — F321 Major depressive disorder, single episode, moderate: Secondary | ICD-10-CM | POA: Diagnosis not present

## 2022-04-14 DIAGNOSIS — F321 Major depressive disorder, single episode, moderate: Secondary | ICD-10-CM | POA: Diagnosis not present

## 2022-04-15 DIAGNOSIS — F321 Major depressive disorder, single episode, moderate: Secondary | ICD-10-CM | POA: Diagnosis not present

## 2022-04-16 DIAGNOSIS — F321 Major depressive disorder, single episode, moderate: Secondary | ICD-10-CM | POA: Diagnosis not present

## 2022-04-18 DIAGNOSIS — F321 Major depressive disorder, single episode, moderate: Secondary | ICD-10-CM | POA: Diagnosis not present

## 2022-04-19 DIAGNOSIS — F321 Major depressive disorder, single episode, moderate: Secondary | ICD-10-CM | POA: Diagnosis not present

## 2022-04-20 DIAGNOSIS — F321 Major depressive disorder, single episode, moderate: Secondary | ICD-10-CM | POA: Diagnosis not present

## 2022-04-21 DIAGNOSIS — F321 Major depressive disorder, single episode, moderate: Secondary | ICD-10-CM | POA: Diagnosis not present

## 2022-04-22 DIAGNOSIS — F321 Major depressive disorder, single episode, moderate: Secondary | ICD-10-CM | POA: Diagnosis not present

## 2022-04-23 DIAGNOSIS — F321 Major depressive disorder, single episode, moderate: Secondary | ICD-10-CM | POA: Diagnosis not present

## 2022-04-25 DIAGNOSIS — F321 Major depressive disorder, single episode, moderate: Secondary | ICD-10-CM | POA: Diagnosis not present

## 2022-04-26 DIAGNOSIS — F321 Major depressive disorder, single episode, moderate: Secondary | ICD-10-CM | POA: Diagnosis not present

## 2022-04-27 DIAGNOSIS — F321 Major depressive disorder, single episode, moderate: Secondary | ICD-10-CM | POA: Diagnosis not present

## 2022-04-28 DIAGNOSIS — F321 Major depressive disorder, single episode, moderate: Secondary | ICD-10-CM | POA: Diagnosis not present

## 2022-04-29 DIAGNOSIS — F321 Major depressive disorder, single episode, moderate: Secondary | ICD-10-CM | POA: Diagnosis not present

## 2022-04-30 DIAGNOSIS — F321 Major depressive disorder, single episode, moderate: Secondary | ICD-10-CM | POA: Diagnosis not present

## 2022-05-02 DIAGNOSIS — F321 Major depressive disorder, single episode, moderate: Secondary | ICD-10-CM | POA: Diagnosis not present

## 2022-05-03 DIAGNOSIS — F321 Major depressive disorder, single episode, moderate: Secondary | ICD-10-CM | POA: Diagnosis not present

## 2022-05-04 DIAGNOSIS — F321 Major depressive disorder, single episode, moderate: Secondary | ICD-10-CM | POA: Diagnosis not present

## 2022-05-05 DIAGNOSIS — F321 Major depressive disorder, single episode, moderate: Secondary | ICD-10-CM | POA: Diagnosis not present

## 2022-05-06 DIAGNOSIS — F321 Major depressive disorder, single episode, moderate: Secondary | ICD-10-CM | POA: Diagnosis not present

## 2022-05-07 DIAGNOSIS — F321 Major depressive disorder, single episode, moderate: Secondary | ICD-10-CM | POA: Diagnosis not present

## 2022-05-09 DIAGNOSIS — F321 Major depressive disorder, single episode, moderate: Secondary | ICD-10-CM | POA: Diagnosis not present

## 2022-05-10 DIAGNOSIS — F321 Major depressive disorder, single episode, moderate: Secondary | ICD-10-CM | POA: Diagnosis not present

## 2022-05-11 DIAGNOSIS — F321 Major depressive disorder, single episode, moderate: Secondary | ICD-10-CM | POA: Diagnosis not present

## 2022-05-12 DIAGNOSIS — F321 Major depressive disorder, single episode, moderate: Secondary | ICD-10-CM | POA: Diagnosis not present

## 2022-05-13 DIAGNOSIS — F321 Major depressive disorder, single episode, moderate: Secondary | ICD-10-CM | POA: Diagnosis not present

## 2022-05-14 DIAGNOSIS — F321 Major depressive disorder, single episode, moderate: Secondary | ICD-10-CM | POA: Diagnosis not present

## 2022-05-16 DIAGNOSIS — F321 Major depressive disorder, single episode, moderate: Secondary | ICD-10-CM | POA: Diagnosis not present

## 2022-05-17 DIAGNOSIS — F321 Major depressive disorder, single episode, moderate: Secondary | ICD-10-CM | POA: Diagnosis not present

## 2022-05-18 DIAGNOSIS — F321 Major depressive disorder, single episode, moderate: Secondary | ICD-10-CM | POA: Diagnosis not present

## 2022-05-19 DIAGNOSIS — F321 Major depressive disorder, single episode, moderate: Secondary | ICD-10-CM | POA: Diagnosis not present

## 2022-05-20 DIAGNOSIS — F321 Major depressive disorder, single episode, moderate: Secondary | ICD-10-CM | POA: Diagnosis not present

## 2022-05-21 DIAGNOSIS — F321 Major depressive disorder, single episode, moderate: Secondary | ICD-10-CM | POA: Diagnosis not present

## 2022-05-23 DIAGNOSIS — F321 Major depressive disorder, single episode, moderate: Secondary | ICD-10-CM | POA: Diagnosis not present

## 2022-05-24 DIAGNOSIS — F321 Major depressive disorder, single episode, moderate: Secondary | ICD-10-CM | POA: Diagnosis not present

## 2022-05-25 DIAGNOSIS — F321 Major depressive disorder, single episode, moderate: Secondary | ICD-10-CM | POA: Diagnosis not present

## 2022-05-26 DIAGNOSIS — F321 Major depressive disorder, single episode, moderate: Secondary | ICD-10-CM | POA: Diagnosis not present

## 2022-05-27 DIAGNOSIS — F321 Major depressive disorder, single episode, moderate: Secondary | ICD-10-CM | POA: Diagnosis not present

## 2022-05-28 DIAGNOSIS — F321 Major depressive disorder, single episode, moderate: Secondary | ICD-10-CM | POA: Diagnosis not present

## 2022-05-31 DIAGNOSIS — F321 Major depressive disorder, single episode, moderate: Secondary | ICD-10-CM | POA: Diagnosis not present

## 2022-06-01 DIAGNOSIS — F321 Major depressive disorder, single episode, moderate: Secondary | ICD-10-CM | POA: Diagnosis not present

## 2022-06-02 DIAGNOSIS — F321 Major depressive disorder, single episode, moderate: Secondary | ICD-10-CM | POA: Diagnosis not present

## 2022-06-03 DIAGNOSIS — F321 Major depressive disorder, single episode, moderate: Secondary | ICD-10-CM | POA: Diagnosis not present

## 2022-06-04 DIAGNOSIS — F321 Major depressive disorder, single episode, moderate: Secondary | ICD-10-CM | POA: Diagnosis not present

## 2022-06-06 DIAGNOSIS — F321 Major depressive disorder, single episode, moderate: Secondary | ICD-10-CM | POA: Diagnosis not present

## 2022-06-07 DIAGNOSIS — F321 Major depressive disorder, single episode, moderate: Secondary | ICD-10-CM | POA: Diagnosis not present

## 2022-06-08 DIAGNOSIS — F321 Major depressive disorder, single episode, moderate: Secondary | ICD-10-CM | POA: Diagnosis not present

## 2022-06-09 DIAGNOSIS — F321 Major depressive disorder, single episode, moderate: Secondary | ICD-10-CM | POA: Diagnosis not present

## 2022-06-10 DIAGNOSIS — F321 Major depressive disorder, single episode, moderate: Secondary | ICD-10-CM | POA: Diagnosis not present

## 2022-06-11 DIAGNOSIS — F321 Major depressive disorder, single episode, moderate: Secondary | ICD-10-CM | POA: Diagnosis not present

## 2022-06-13 DIAGNOSIS — F321 Major depressive disorder, single episode, moderate: Secondary | ICD-10-CM | POA: Diagnosis not present

## 2022-06-14 DIAGNOSIS — F321 Major depressive disorder, single episode, moderate: Secondary | ICD-10-CM | POA: Diagnosis not present

## 2022-06-15 DIAGNOSIS — F321 Major depressive disorder, single episode, moderate: Secondary | ICD-10-CM | POA: Diagnosis not present

## 2022-06-16 DIAGNOSIS — F321 Major depressive disorder, single episode, moderate: Secondary | ICD-10-CM | POA: Diagnosis not present

## 2022-06-17 DIAGNOSIS — F321 Major depressive disorder, single episode, moderate: Secondary | ICD-10-CM | POA: Diagnosis not present

## 2022-06-18 DIAGNOSIS — F321 Major depressive disorder, single episode, moderate: Secondary | ICD-10-CM | POA: Diagnosis not present

## 2022-06-20 DIAGNOSIS — F321 Major depressive disorder, single episode, moderate: Secondary | ICD-10-CM | POA: Diagnosis not present

## 2022-06-21 DIAGNOSIS — F321 Major depressive disorder, single episode, moderate: Secondary | ICD-10-CM | POA: Diagnosis not present

## 2022-06-22 DIAGNOSIS — F321 Major depressive disorder, single episode, moderate: Secondary | ICD-10-CM | POA: Diagnosis not present

## 2022-06-23 DIAGNOSIS — F321 Major depressive disorder, single episode, moderate: Secondary | ICD-10-CM | POA: Diagnosis not present

## 2022-06-24 DIAGNOSIS — F321 Major depressive disorder, single episode, moderate: Secondary | ICD-10-CM | POA: Diagnosis not present

## 2022-06-25 DIAGNOSIS — F321 Major depressive disorder, single episode, moderate: Secondary | ICD-10-CM | POA: Diagnosis not present

## 2022-06-27 DIAGNOSIS — F321 Major depressive disorder, single episode, moderate: Secondary | ICD-10-CM | POA: Diagnosis not present

## 2022-06-28 DIAGNOSIS — F321 Major depressive disorder, single episode, moderate: Secondary | ICD-10-CM | POA: Diagnosis not present

## 2022-06-29 DIAGNOSIS — F321 Major depressive disorder, single episode, moderate: Secondary | ICD-10-CM | POA: Diagnosis not present

## 2022-06-30 DIAGNOSIS — F321 Major depressive disorder, single episode, moderate: Secondary | ICD-10-CM | POA: Diagnosis not present

## 2022-07-01 DIAGNOSIS — F321 Major depressive disorder, single episode, moderate: Secondary | ICD-10-CM | POA: Diagnosis not present

## 2022-07-02 DIAGNOSIS — F321 Major depressive disorder, single episode, moderate: Secondary | ICD-10-CM | POA: Diagnosis not present

## 2022-07-04 DIAGNOSIS — F321 Major depressive disorder, single episode, moderate: Secondary | ICD-10-CM | POA: Diagnosis not present

## 2022-07-05 DIAGNOSIS — F321 Major depressive disorder, single episode, moderate: Secondary | ICD-10-CM | POA: Diagnosis not present

## 2022-07-06 DIAGNOSIS — F321 Major depressive disorder, single episode, moderate: Secondary | ICD-10-CM | POA: Diagnosis not present

## 2022-07-07 DIAGNOSIS — F321 Major depressive disorder, single episode, moderate: Secondary | ICD-10-CM | POA: Diagnosis not present

## 2022-07-08 DIAGNOSIS — F321 Major depressive disorder, single episode, moderate: Secondary | ICD-10-CM | POA: Diagnosis not present

## 2022-07-09 DIAGNOSIS — F321 Major depressive disorder, single episode, moderate: Secondary | ICD-10-CM | POA: Diagnosis not present

## 2022-07-11 DIAGNOSIS — F321 Major depressive disorder, single episode, moderate: Secondary | ICD-10-CM | POA: Diagnosis not present

## 2022-07-12 DIAGNOSIS — F321 Major depressive disorder, single episode, moderate: Secondary | ICD-10-CM | POA: Diagnosis not present

## 2022-07-13 DIAGNOSIS — F321 Major depressive disorder, single episode, moderate: Secondary | ICD-10-CM | POA: Diagnosis not present

## 2022-07-14 DIAGNOSIS — F321 Major depressive disorder, single episode, moderate: Secondary | ICD-10-CM | POA: Diagnosis not present

## 2022-07-15 DIAGNOSIS — F321 Major depressive disorder, single episode, moderate: Secondary | ICD-10-CM | POA: Diagnosis not present

## 2022-07-16 DIAGNOSIS — F321 Major depressive disorder, single episode, moderate: Secondary | ICD-10-CM | POA: Diagnosis not present

## 2022-07-18 DIAGNOSIS — F321 Major depressive disorder, single episode, moderate: Secondary | ICD-10-CM | POA: Diagnosis not present

## 2022-07-19 DIAGNOSIS — F321 Major depressive disorder, single episode, moderate: Secondary | ICD-10-CM | POA: Diagnosis not present

## 2022-07-20 DIAGNOSIS — F321 Major depressive disorder, single episode, moderate: Secondary | ICD-10-CM | POA: Diagnosis not present

## 2022-07-21 DIAGNOSIS — F321 Major depressive disorder, single episode, moderate: Secondary | ICD-10-CM | POA: Diagnosis not present

## 2022-07-22 DIAGNOSIS — F321 Major depressive disorder, single episode, moderate: Secondary | ICD-10-CM | POA: Diagnosis not present

## 2022-07-23 DIAGNOSIS — F321 Major depressive disorder, single episode, moderate: Secondary | ICD-10-CM | POA: Diagnosis not present

## 2022-07-25 DIAGNOSIS — F321 Major depressive disorder, single episode, moderate: Secondary | ICD-10-CM | POA: Diagnosis not present

## 2022-07-26 DIAGNOSIS — F321 Major depressive disorder, single episode, moderate: Secondary | ICD-10-CM | POA: Diagnosis not present

## 2022-07-27 DIAGNOSIS — F321 Major depressive disorder, single episode, moderate: Secondary | ICD-10-CM | POA: Diagnosis not present

## 2022-07-28 DIAGNOSIS — F321 Major depressive disorder, single episode, moderate: Secondary | ICD-10-CM | POA: Diagnosis not present

## 2022-07-29 DIAGNOSIS — F321 Major depressive disorder, single episode, moderate: Secondary | ICD-10-CM | POA: Diagnosis not present

## 2022-07-30 DIAGNOSIS — F321 Major depressive disorder, single episode, moderate: Secondary | ICD-10-CM | POA: Diagnosis not present

## 2022-08-01 DIAGNOSIS — F321 Major depressive disorder, single episode, moderate: Secondary | ICD-10-CM | POA: Diagnosis not present

## 2022-08-02 DIAGNOSIS — F321 Major depressive disorder, single episode, moderate: Secondary | ICD-10-CM | POA: Diagnosis not present

## 2022-08-03 DIAGNOSIS — F321 Major depressive disorder, single episode, moderate: Secondary | ICD-10-CM | POA: Diagnosis not present

## 2022-08-04 DIAGNOSIS — F321 Major depressive disorder, single episode, moderate: Secondary | ICD-10-CM | POA: Diagnosis not present

## 2022-08-05 DIAGNOSIS — F321 Major depressive disorder, single episode, moderate: Secondary | ICD-10-CM | POA: Diagnosis not present

## 2022-08-06 DIAGNOSIS — F321 Major depressive disorder, single episode, moderate: Secondary | ICD-10-CM | POA: Diagnosis not present

## 2022-08-08 DIAGNOSIS — F321 Major depressive disorder, single episode, moderate: Secondary | ICD-10-CM | POA: Diagnosis not present

## 2022-08-09 DIAGNOSIS — F321 Major depressive disorder, single episode, moderate: Secondary | ICD-10-CM | POA: Diagnosis not present

## 2022-08-10 DIAGNOSIS — F321 Major depressive disorder, single episode, moderate: Secondary | ICD-10-CM | POA: Diagnosis not present

## 2022-08-11 DIAGNOSIS — F321 Major depressive disorder, single episode, moderate: Secondary | ICD-10-CM | POA: Diagnosis not present

## 2022-08-12 DIAGNOSIS — F321 Major depressive disorder, single episode, moderate: Secondary | ICD-10-CM | POA: Diagnosis not present

## 2022-08-13 DIAGNOSIS — F321 Major depressive disorder, single episode, moderate: Secondary | ICD-10-CM | POA: Diagnosis not present

## 2022-08-15 DIAGNOSIS — F321 Major depressive disorder, single episode, moderate: Secondary | ICD-10-CM | POA: Diagnosis not present

## 2022-08-16 DIAGNOSIS — F321 Major depressive disorder, single episode, moderate: Secondary | ICD-10-CM | POA: Diagnosis not present

## 2022-08-17 DIAGNOSIS — F321 Major depressive disorder, single episode, moderate: Secondary | ICD-10-CM | POA: Diagnosis not present

## 2022-08-18 DIAGNOSIS — F321 Major depressive disorder, single episode, moderate: Secondary | ICD-10-CM | POA: Diagnosis not present

## 2022-08-19 DIAGNOSIS — F321 Major depressive disorder, single episode, moderate: Secondary | ICD-10-CM | POA: Diagnosis not present

## 2022-08-20 DIAGNOSIS — F321 Major depressive disorder, single episode, moderate: Secondary | ICD-10-CM | POA: Diagnosis not present

## 2022-08-22 DIAGNOSIS — F321 Major depressive disorder, single episode, moderate: Secondary | ICD-10-CM | POA: Diagnosis not present

## 2022-08-23 DIAGNOSIS — F321 Major depressive disorder, single episode, moderate: Secondary | ICD-10-CM | POA: Diagnosis not present

## 2022-08-24 DIAGNOSIS — F321 Major depressive disorder, single episode, moderate: Secondary | ICD-10-CM | POA: Diagnosis not present

## 2022-08-25 DIAGNOSIS — F321 Major depressive disorder, single episode, moderate: Secondary | ICD-10-CM | POA: Diagnosis not present

## 2022-08-26 DIAGNOSIS — F321 Major depressive disorder, single episode, moderate: Secondary | ICD-10-CM | POA: Diagnosis not present

## 2022-08-27 DIAGNOSIS — F321 Major depressive disorder, single episode, moderate: Secondary | ICD-10-CM | POA: Diagnosis not present

## 2022-08-29 DIAGNOSIS — F321 Major depressive disorder, single episode, moderate: Secondary | ICD-10-CM | POA: Diagnosis not present

## 2022-08-30 DIAGNOSIS — F321 Major depressive disorder, single episode, moderate: Secondary | ICD-10-CM | POA: Diagnosis not present

## 2022-09-05 DIAGNOSIS — F321 Major depressive disorder, single episode, moderate: Secondary | ICD-10-CM | POA: Diagnosis not present

## 2022-09-06 DIAGNOSIS — F321 Major depressive disorder, single episode, moderate: Secondary | ICD-10-CM | POA: Diagnosis not present

## 2022-09-12 DIAGNOSIS — F321 Major depressive disorder, single episode, moderate: Secondary | ICD-10-CM | POA: Diagnosis not present

## 2022-09-13 DIAGNOSIS — F321 Major depressive disorder, single episode, moderate: Secondary | ICD-10-CM | POA: Diagnosis not present

## 2022-09-19 DIAGNOSIS — F321 Major depressive disorder, single episode, moderate: Secondary | ICD-10-CM | POA: Diagnosis not present

## 2022-09-20 DIAGNOSIS — F321 Major depressive disorder, single episode, moderate: Secondary | ICD-10-CM | POA: Diagnosis not present

## 2022-09-26 DIAGNOSIS — F321 Major depressive disorder, single episode, moderate: Secondary | ICD-10-CM | POA: Diagnosis not present

## 2022-09-27 DIAGNOSIS — F321 Major depressive disorder, single episode, moderate: Secondary | ICD-10-CM | POA: Diagnosis not present

## 2022-10-03 DIAGNOSIS — F321 Major depressive disorder, single episode, moderate: Secondary | ICD-10-CM | POA: Diagnosis not present

## 2022-10-04 IMAGING — CR DG CHEST 2V
2 series · 2 of 2 positions shown · non-contrast
Comparison: 09/06/2011

CLINICAL DATA: Shortness of breath

EXAM:
CHEST - 2 VIEW

[w chest lat]
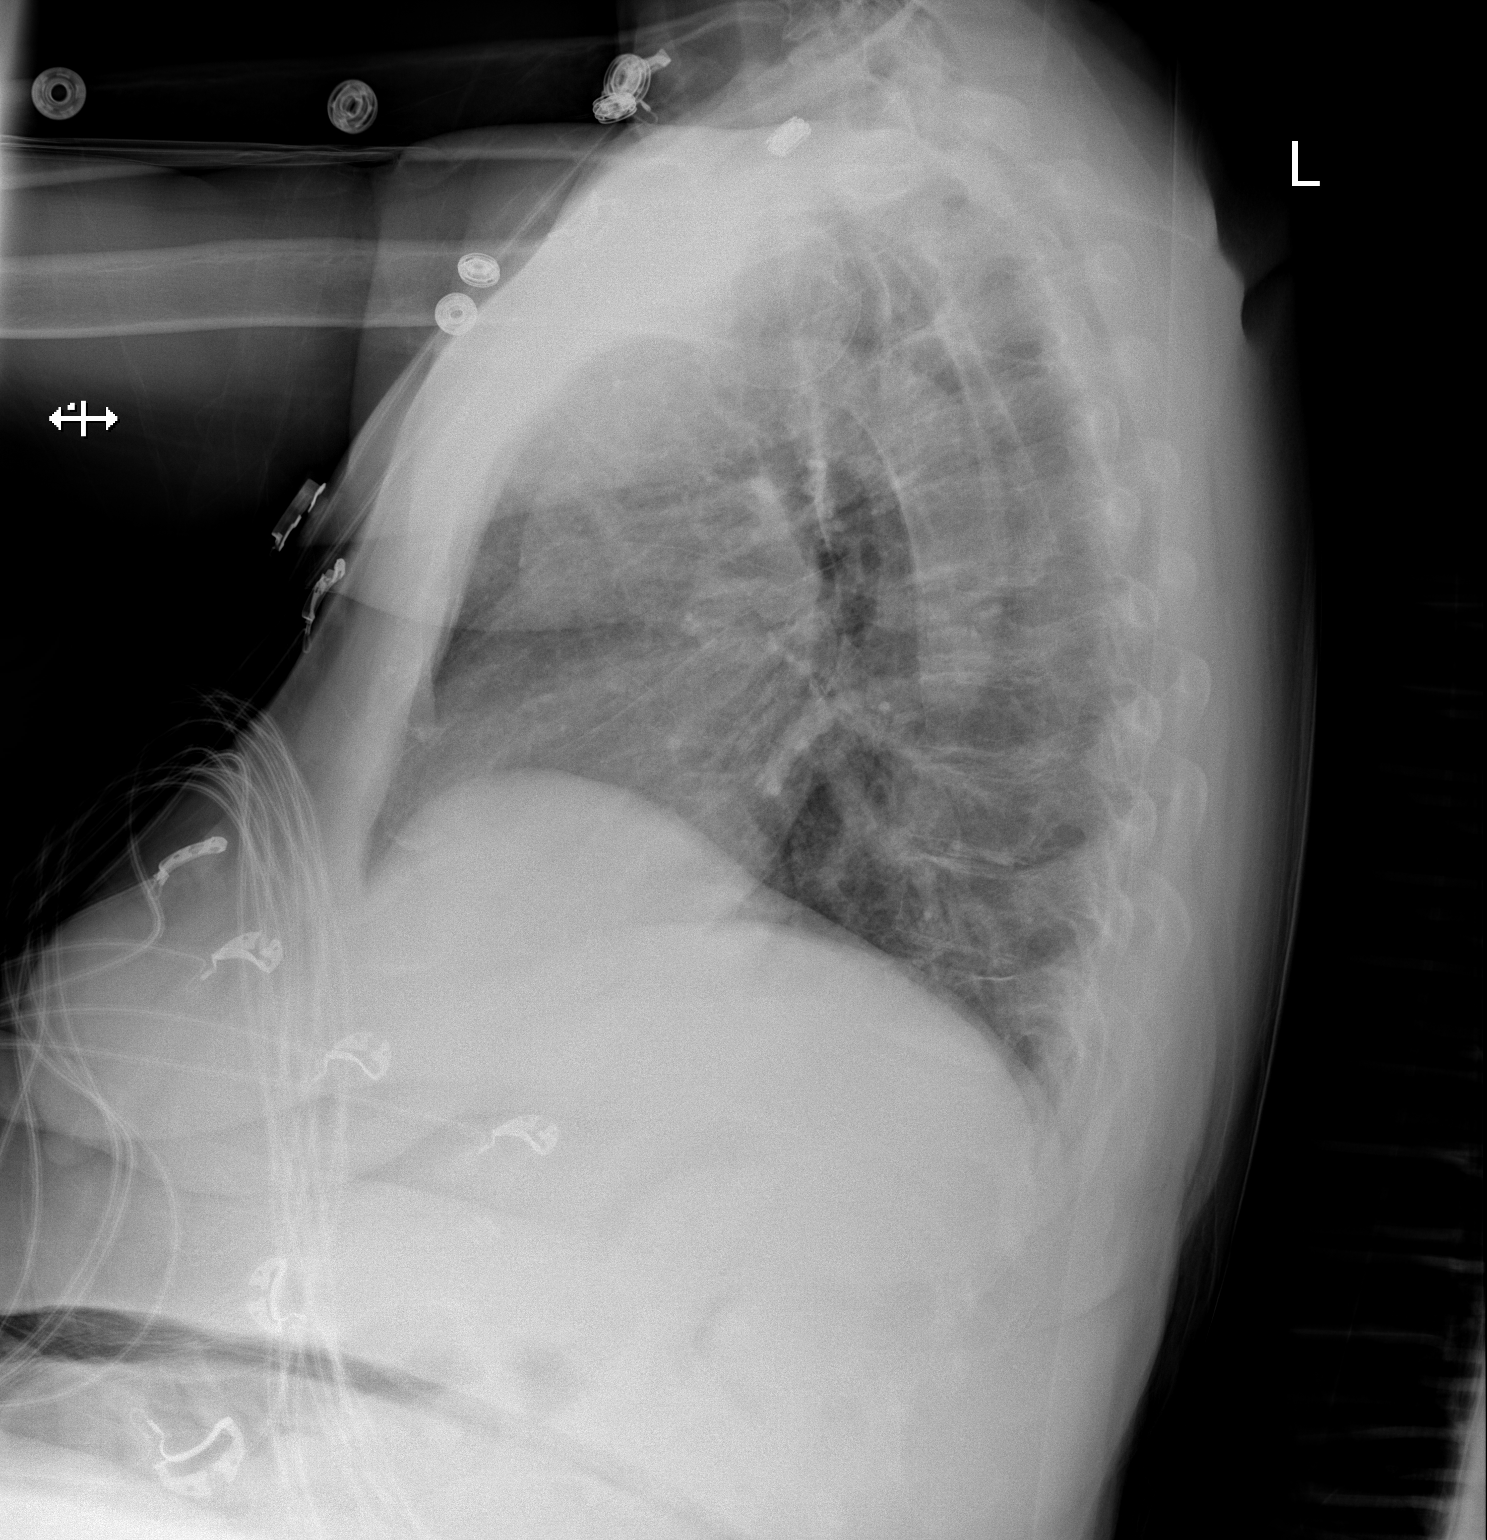

[x chest ap]
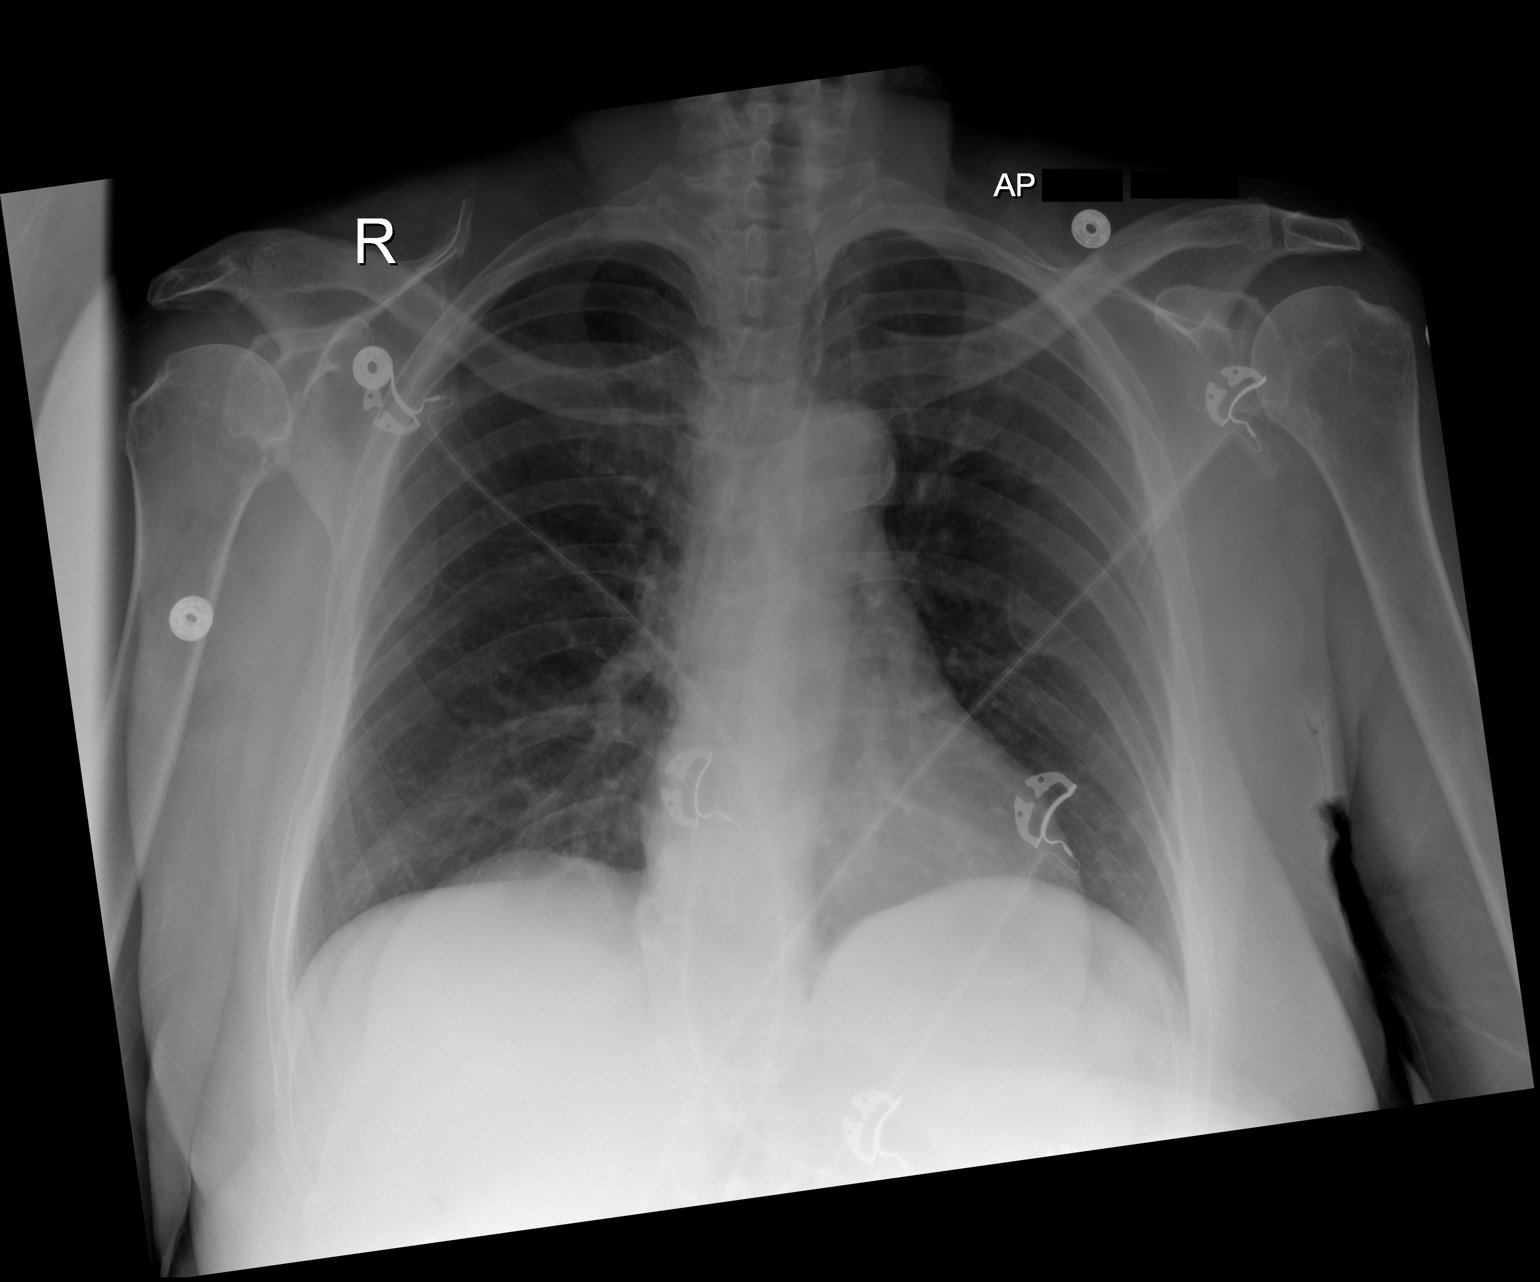

[2 of 2 positions shown; findings below may reference images not displayed]

FINDINGS: The heart size and mediastinal contours are within normal limits.
Aortic atherosclerosis. Both lungs are clear. The visualized
skeletal structures are unremarkable.
IMPRESSION: No active cardiopulmonary disease.

## 2022-10-05 DIAGNOSIS — F321 Major depressive disorder, single episode, moderate: Secondary | ICD-10-CM | POA: Diagnosis not present

## 2022-10-11 DIAGNOSIS — F321 Major depressive disorder, single episode, moderate: Secondary | ICD-10-CM | POA: Diagnosis not present

## 2022-10-13 DIAGNOSIS — F321 Major depressive disorder, single episode, moderate: Secondary | ICD-10-CM | POA: Diagnosis not present

## 2023-01-30 ENCOUNTER — Telehealth: Payer: Self-pay

## 2023-01-30 NOTE — Telephone Encounter (Signed)
LVM to call back to schedule apt with PCP. AS, CMA
